# Patient Record
Sex: Male | Born: 1995 | Race: White | Hispanic: No | Marital: Single | Smoking: Never smoker
Health system: Southern US, Community
[De-identification: ages and names within clinical notes are randomized; demographics above are authoritative.]

## PROBLEM LIST (undated history)

## (undated) DIAGNOSIS — K219 Gastro-esophageal reflux disease without esophagitis: Secondary | ICD-10-CM

---

## 2015-05-26 ENCOUNTER — Emergency Department
Admission: EM | Admit: 2015-05-26 | Discharge: 2015-05-26 | Disposition: A | Discharge status: Home / Self Care | Payer: Commercial Managed Care - HMO | Attending: Emergency Medicine | Admitting: Emergency Medicine

## 2015-05-26 ENCOUNTER — Encounter: Payer: Self-pay | Admitting: Emergency Medicine

## 2015-05-26 DIAGNOSIS — K219 Gastro-esophageal reflux disease without esophagitis: Secondary | ICD-10-CM | POA: Diagnosis not present

## 2015-05-26 DIAGNOSIS — R1013 Epigastric pain: Secondary | ICD-10-CM | POA: Diagnosis present

## 2015-05-26 DIAGNOSIS — J020 Streptococcal pharyngitis: Secondary | ICD-10-CM | POA: Diagnosis not present

## 2015-05-26 LAB — LIPASE, BLOOD: Lipase: 22 U/L (ref 11–51)

## 2015-05-26 LAB — COMPREHENSIVE METABOLIC PANEL
ALK PHOS: 89 U/L (ref 38–126)
ALT: 18 U/L (ref 17–63)
AST: 20 U/L (ref 15–41)
Albumin: 5.1 g/dL — ABNORMAL HIGH (ref 3.5–5.0)
Anion gap: 15 (ref 5–15)
BILIRUBIN TOTAL: 1.5 mg/dL — AB (ref 0.3–1.2)
BUN: 17 mg/dL (ref 6–20)
CALCIUM: 10.1 mg/dL (ref 8.9–10.3)
CO2: 27 mmol/L (ref 22–32)
CREATININE: 0.79 mg/dL (ref 0.61–1.24)
Chloride: 98 mmol/L — ABNORMAL LOW (ref 101–111)
GFR calc Af Amer: >60 mL/min (ref >60)
Glucose, Bld: 84 mg/dL (ref 65–99)
POTASSIUM: 4.4 mmol/L (ref 3.5–5.1)
Sodium: 140 mmol/L (ref 135–145)
TOTAL PROTEIN: 9.1 g/dL — AB (ref 6.5–8.1)

## 2015-05-26 LAB — URINALYSIS COMPLETE WITH MICROSCOPIC (ARMC ONLY)
Bacteria, UA: NONE SEEN
Bilirubin Urine: NEGATIVE
GLUCOSE, UA: NEGATIVE mg/dL
Hgb urine dipstick: NEGATIVE
LEUKOCYTES UA: NEGATIVE
NITRITE: NEGATIVE
Protein, ur: 30 mg/dL — AB
SPECIFIC GRAVITY, URINE: 1.028 (ref 1.005–1.030)
pH: 5 (ref 5.0–8.0)

## 2015-05-26 LAB — CBC
HCT: 44.5 % (ref 40.0–52.0)
Hemoglobin: 15.1 g/dL (ref 13.0–18.0)
MCH: 29.3 pg (ref 26.0–34.0)
MCHC: 34 g/dL (ref 32.0–36.0)
MCV: 86 fL (ref 80.0–100.0)
PLATELETS: 176 10*3/uL (ref 150–440)
RBC: 5.17 MIL/uL (ref 4.40–5.90)
RDW: 12.3 % (ref 11.5–14.5)
WBC: 8.7 10*3/uL (ref 3.8–10.6)

## 2015-05-26 MED ORDER — ONDANSETRON 8 MG PO TBDP
ORAL_TABLET | ORAL | Status: AC
  Administered 2015-05-26: 8 mg via ORAL
  Filled 2015-05-26: qty 1

## 2015-05-26 MED ORDER — GI COCKTAIL ~~LOC~~
30.0000 mL | Freq: Once | ORAL | Status: AC
  Administered 2015-05-26: 30 mL via ORAL
  Filled 2015-05-26: qty 30

## 2015-05-26 MED ORDER — ALUMINUM & MAGNESIUM HYDROXIDE 200-200 MG/5ML PO SUSP: 5.0000 mL | Freq: Four times a day (QID) | ORAL | Status: AC | PRN

## 2015-05-26 MED ORDER — LIDOCAINE VISCOUS 2 % MT SOLN: 5.0000 mL | OROMUCOSAL | Status: AC | PRN

## 2015-05-26 MED ORDER — ONDANSETRON 4 MG PO TBDP: 4.0000 mg | ORAL_TABLET | Freq: Three times a day (TID) | ORAL | Status: AC | PRN

## 2015-05-26 MED ORDER — ONDANSETRON 8 MG PO TBDP
8.0000 mg | ORAL_TABLET | Freq: Once | ORAL | Status: AC
  Administered 2015-05-26: 8 mg via ORAL

## 2015-05-26 MED ORDER — SODIUM CHLORIDE 0.9 % IV SOLN
Freq: Once | INTRAVENOUS | Status: AC
  Administered 2015-05-26: 14:00:00 via INTRAVENOUS

## 2015-05-26 MED ORDER — ONDANSETRON HCL 4 MG/2ML IJ SOLN
4.0000 mg | Freq: Once | INTRAMUSCULAR | Status: AC
  Administered 2015-05-26: 4 mg via INTRAVENOUS
  Filled 2015-05-26: qty 2

## 2015-05-26 NOTE — ED Provider Notes (Signed)
[facility] Emergency Department Provider Note  ____________________________________________  Time seen: Approximately 2:29 PM  I have reviewed the triage vital signs and the nursing notes.   HISTORY  Chief Complaint Abdominal Pain    HPI Martin Ramos is a 20 y.o. male who presentsto the emergency department complaining of epigastric pain. Patient has a diagnosis of strep throat and is on antibiotics for same. He has been followed by his pediatrician. Patient is an into his pediatrician this morning and labs were undertaken with a negative Monospot as well as a CBC within normal limits. Patient continued to have a burning sensation in his chest and upper epigastric region who presents to the emergency department. Patient's diagnosis is strep was 4 days prior and he has been taking amoxicillin since. Patient endorses decreased oral intake due to the burning sensation when he eats. He has not been taking his antibiotic with food. He denies any increase in fevers or chills, difficulty swallowing, chest pain, shortness of breath, nausea or vomiting, diarrhea or constipation. Denies any urinary complaints or flank pain.  She does endorse a history of GERD and states that he takes omeprazole for same. He is not taking his omeprazole within the last several days.    History reviewed. No pertinent past medical history.  There are no active problems to display for this patient.   History reviewed. No pertinent past surgical history.  Current Outpatient Rx  Name  Route  Sig  Dispense  Refill  . aluminum-magnesium hydroxide 200-200 MG/5ML suspension   Oral   Take 5 mLs by mouth every 6 (six) hours as needed for indigestion.   100 mL   0   . lidocaine (XYLOCAINE) 2 % solution   Mouth/Throat   Use as directed 5 mLs in the mouth or throat as needed for mouth pain.   50 mL   0   . ondansetron (ZOFRAN-ODT) 4 MG disintegrating tablet   Oral   Take 1 tablet  (4 mg total) by mouth every 8 (eight) hours as needed for nausea or vomiting.   20 tablet   0     Allergies Review of patient's allergies indicates no known allergies.  No family history on file.  Social History Social History  Substance Use Topics  . Smoking status: Never Smoker   . Smokeless tobacco: None  . Alcohol Use: No     Review of Systems  Constitutional: No fever/chills Eyes: No visual changes. No discharge ENT: Positive for sore throat but states that pain is improving on antibiotics. Cardiovascular: no chest pain. Respiratory: no cough. No SOB. Gastrointestinal: Positive for epigastric pain. Positive for "burning sensation" when eating.  No nausea, no vomiting.  No diarrhea.  No constipation. Genitourinary: Negative for dysuria. No hematuria Musculoskeletal: Negative for back pain. Skin: Negative for rash. Neurological: Negative for headaches, focal weakness or numbness. 10-point ROS otherwise negative.  ____________________________________________   PHYSICAL EXAM:  VITAL SIGNS: ED Triage Vitals  Enc Vitals Group     BP 05/26/15 1203 145/76 mmHg     Pulse Rate 05/26/15 1203 107     Resp 05/26/15 1203 18     Temp 05/26/15 1203 98.7 F (37.1 C)     Temp Source 05/26/15 1203 Oral     SpO2 05/26/15 1203 99 %     Weight 05/26/15 1203 142 lb (64.411 kg)     Height 05/26/15 1203  (1.651 m)     Head Cir --  Peak Flow --      Pain Score 05/26/15 1200 7     Pain Loc --      Pain Edu? --      Excl. in GC? --      Constitutional: Alert and oriented. Well appearing and in no acute distress. Eyes: Conjunctivae are normal. PERRL. EOMI. Head: Atraumatic. ENT:      Ears:       Nose: No congestion/rhinnorhea.      Mouth/Throat: Mucous membranes are moist. Oropharynx is slightly erythematous. Tonsils are only her thumb is edematous. No exudates visualized. Neck: No stridor.   Hematological/Lymphatic/Immunilogical: No cervical  lymphadenopathy. Cardiovascular: Normal rate, regular rhythm. Normal S1 and S2.  Good peripheral circulation. Respiratory: Normal respiratory effort without tachypnea or retractions. Lungs CTAB. Gastrointestinal: Bowel sounds 4 quadrants Soft to palpation. Mildly tender to palpation epigastric region. No guarding or rigidity.. No distention. No CVA tenderness. Musculoskeletal: No lower extremity tenderness nor edema.  No joint effusions. Neurologic:  Normal speech and language. No gross focal neurologic deficits are appreciated.  Skin:  Skin is warm, dry and intact. No rash noted. Psychiatric: Mood and affect are normal. Speech and behavior are normal. Patient exhibits appropriate insight and judgement.   ____________________________________________   LABS (all labs ordered are listed, but only abnormal results are displayed)  Labs Reviewed  COMPREHENSIVE METABOLIC PANEL - Abnormal; Notable for the following:    Chloride 98 (*)    Total Protein 9.1 (*)    Albumin 5.1 (*)    Total Bilirubin 1.5 (*)    All other components within normal limits  URINALYSIS COMPLETEWITH MICROSCOPIC ([facility] ONLY) - Abnormal; Notable for the following:    Color, Urine YELLOW (*)    APPearance CLEAR (*)    Ketones, ur 2+ (*)    Protein, ur 30 (*)    Squamous Epithelial / LPF 0-5 (*)    All other components within normal limits  LIPASE, BLOOD  CBC   ____________________________________________  EKG   ____________________________________________  RADIOLOGY   No results found.  ____________________________________________    PROCEDURES  Procedure(s) performed:       Medications  ondansetron (ZOFRAN) injection 4 mg (4 mg Intravenous Given 05/26/15 1223)  0.9 %  sodium chloride infusion ( Intravenous Stopped 05/26/15 1521)  gi cocktail (Maalox,Lidocaine,Donnatal) (30 mLs Oral Given 05/26/15 1520)  ondansetron (ZOFRAN-ODT) disintegrating tablet 8 mg (8 mg Oral Given 05/26/15 1455)      ____________________________________________   INITIAL IMPRESSION / ASSESSMENT AND PLAN / ED COURSE  Pertinent labs & imaging results that were available during my care of the patient were reviewed by me and considered in my medical decision making (see chart for details).  Patient's diagnosis is consistent with GERD. Patient also has a known diagnosis of strep throat for which he is on amoxicillin. Patient states that he has had some upper abdominal pain and burning sensation in his chest. He does have a history of GERD but has not been taking his omeprazole for same. Patient has been taking his antibiotics without food states that symptoms of strep throat or improving but the abdominal pain and burning have increased. Patient's lab results are are unremarkable at this time. Discussed findings with patient and his mother. Patient was given a GI cocktail here in the emergency department and a food challenge following. Patient was able to tolerate oral intake with no palpitations and no return of symptoms.. Patient will be discharged home with prescriptions for Maalox, viscous lidocaine, Zofran. Patient  is to follow up with primary care provider if symptoms persist past this treatment course. Patient is given ED precautions to return to the ED for any worsening or new symptoms.     ____________________________________________  FINAL CLINICAL IMPRESSION(S) / ED DIAGNOSES  Final diagnoses:  Gastroesophageal reflux disease, esophagitis presence not specified  Strep throat      NEW MEDICATIONS STARTED DURING THIS VISIT:  New Prescriptions   ALUMINUM-MAGNESIUM HYDROXIDE 200-200 MG/5ML SUSPENSION    Take 5 mLs by mouth every 6 (six) hours as needed for indigestion.   LIDOCAINE (XYLOCAINE) 2 % SOLUTION    Use as directed 5 mLs in the mouth or throat as needed for mouth pain.   ONDANSETRON (ZOFRAN-ODT) 4 MG DISINTEGRATING TABLET    Take 1 tablet (4 mg total) by mouth every 8 (eight) hours  as needed for nausea or vomiting.        Delorise Royals Cuthriell, PA-C 05/26/15 1631  Rockne Menghini, MD 05/30/15 1941

## 2015-05-26 NOTE — ED Notes (Signed)
Per mother he was recently dx'd with strep   No having n/v and epigastric pain

## 2015-05-26 NOTE — Discharge Instructions (Signed)
Gastroesophageal Reflux Disease, Adult Normally, food travels down the esophagus and stays in the stomach to be digested. However, when a person has gastroesophageal reflux disease (GERD), food and stomach acid move back up into the esophagus. When this happens, the esophagus becomes sore and inflamed. Over time, GERD can create small holes (ulcers) in the lining of the esophagus.  CAUSES This condition is caused by a problem with the muscle between the esophagus and the stomach (lower esophageal sphincter, or LES). Normally, the LES muscle closes after food passes through the esophagus to the stomach. When the LES is weakened or abnormal, it does not close properly, and that allows food and stomach acid to go back up into the esophagus. The LES can be weakened by certain dietary substances, medicines, and medical conditions, including:  Tobacco use.  Pregnancy.  Having a hiatal hernia.  Heavy alcohol use.  Certain foods and beverages, such as coffee, chocolate, onions, and peppermint. RISK FACTORS This condition is more likely to develop in:  People who have an increased body weight.  People who have connective tissue disorders.  People who use NSAID medicines. SYMPTOMS Symptoms of this condition include:  Heartburn.  Difficult or painful swallowing.  The feeling of having a lump in the throat.  Abitter taste in the mouth.  Bad breath.  Having a large amount of saliva.  Having an upset or bloated stomach.  Belching.  Chest pain.  Shortness of breath or wheezing.  Ongoing (chronic) cough or a night-time cough.  Wearing away of tooth enamel.  Weight loss. Different conditions can cause chest pain. Make sure to see your health care provider if you experience chest pain. DIAGNOSIS Your health care provider will take a medical history and perform a physical exam. To determine if you have mild or severe GERD, your health care provider may also monitor how you respond  to treatment. You may also have other tests, including:  An endoscopy toexamine your stomach and esophagus with a small camera.  A test thatmeasures the acidity level in your esophagus.  A test thatmeasures how much pressure is on your esophagus.  A barium swallow or modified barium swallow to show the shape, size, and functioning of your esophagus. TREATMENT The goal of treatment is to help relieve your symptoms and to prevent complications. Treatment for this condition may vary depending on how severe your symptoms are. Your health care provider may recommend:  Changes to your diet.  Medicine.  Surgery. HOME CARE INSTRUCTIONS Diet  Follow a diet as recommended by your health care provider. This may involve avoiding foods and drinks such as:  Coffee and tea (with or without caffeine).  Drinks that containalcohol.  Energy drinks and sports drinks.  Carbonated drinks or sodas.  Chocolate and cocoa.  Peppermint and mint flavorings.  Garlic and onions.  Horseradish.  Spicy and acidic foods, including peppers, chili powder, curry powder, vinegar, hot sauces, and barbecue sauce.  Citrus fruit juices and citrus fruits, such as oranges, lemons, and limes.  Tomato-based foods, such as red sauce, chili, salsa, and pizza with red sauce.  Fried and fatty foods, such as donuts, french fries, potato chips, and high-fat dressings.  High-fat meats, such as hot dogs and fatty cuts of red and white meats, such as rib eye steak, sausage, ham, and bacon.  High-fat dairy items, such as whole milk, butter, and cream cheese.  Eat small, frequent meals instead of large meals.  Avoid drinking large amounts of liquid with your  meals.  Avoid eating meals during the 2-3 hours before bedtime.  Avoid lying down right after you eat.  Do not exercise right after you eat. General Instructions  Pay attention to any changes in your symptoms.  Take over-the-counter and prescription  medicines only as told by your health care provider. Do not take aspirin, ibuprofen, or other NSAIDs unless your health care provider told you to do so.  Do not use any tobacco products, including cigarettes, chewing tobacco, and e-cigarettes. If you need help quitting, ask your health care provider.  Wear loose-fitting clothing. Do not wear anything tight around your waist that causes pressure on your abdomen.  Raise (elevate) the head of your bed 6 inches (15cm).  Try to reduce your stress, such as with yoga or meditation. If you need help reducing stress, ask your health care provider.  If you are overweight, reduce your weight to an amount that is healthy for you. Ask your health care provider for guidance about a safe weight loss goal.  Keep all follow-up visits as told by your health care provider. This is important. SEEK MEDICAL CARE IF:  You have new symptoms.  You have unexplained weight loss.  You have difficulty swallowing, or it hurts to swallow.  You have wheezing or a persistent cough.  Your symptoms do not improve with treatment.  You have a hoarse voice. SEEK IMMEDIATE MEDICAL CARE IF:  You have pain in your arms, neck, jaw, teeth, or back.  You feel sweaty, dizzy, or light-headed.  You have chest pain or shortness of breath.  You vomit and your vomit looks like blood or coffee grounds.  You faint.  Your stool is bloody or black.  You cannot swallow, drink, or eat.   This information is not intended to replace advice given to you by your health care provider. Make sure you discuss any questions you have with your health care provider.   Document Released: 01/31/2005 Document Revised: 01/12/2015 Document Reviewed: 08/18/2014 Elsevier Interactive Patient Education 2016 [place] for Gastroesophageal Reflux Disease, Adult When you have gastroesophageal reflux disease (GERD), the foods you eat and your eating habits are very important.  Choosing the right foods can help ease the discomfort of GERD. WHAT GENERAL GUIDELINES DO I NEED TO FOLLOW?  Choose fruits, vegetables, whole grains, low-fat dairy products, and low-fat meat, fish, and poultry.  Limit fats such as oils, salad dressings, butter, nuts, and avocado.  Keep a food diary to identify foods that cause symptoms.  Avoid foods that cause reflux. These may be different for different people.  Eat frequent small meals instead of three large meals each day.  Eat your meals slowly, in a relaxed setting.  Limit fried foods.  Cook foods using methods other than frying.  Avoid drinking alcohol.  Avoid drinking large amounts of liquids with your meals.  Avoid bending over or lying down until 2-3 hours after eating. WHAT FOODS ARE NOT RECOMMENDED? The following are some foods and drinks that may worsen your symptoms: Vegetables Tomatoes. Tomato juice. Tomato and spaghetti sauce. Chili peppers. Onion and garlic. Horseradish. Fruits Oranges, grapefruit, and lemon (fruit and juice). Meats High-fat meats, fish, and poultry. This includes hot dogs, ribs, ham, sausage, salami, and bacon. Dairy Whole milk and chocolate milk. Sour cream. Cream. Butter. Ice cream. Cream cheese.  Beverages Coffee and tea, with or without caffeine. Carbonated beverages or energy drinks. Condiments Hot sauce. Barbecue sauce.  Sweets/Desserts Chocolate and cocoa. Donuts. Peppermint and  spearmint. Fats and Oils High-fat foods, including [place] fries and potato chips. Other Vinegar. Strong spices, such as black pepper, white pepper, red pepper, cayenne, curry powder, cloves, ginger, and chili powder. The items listed above may not be a complete list of foods and beverages to avoid. Contact your dietitian for more information.   This information is not intended to replace advice given to you by your health care provider. Make sure you discuss any questions you have with your health care  provider.   Document Released: 04/23/2005 Document Revised: 05/14/2014 Document Reviewed: 02/25/2013 Elsevier Interactive Patient Education 2016 [employer]  Strep Throat Strep throat is a bacterial infection of the throat. Your health care provider may call the infection tonsillitis or pharyngitis, depending on whether there is swelling in the tonsils or at the back of the throat. Strep throat is most common during the cold months of the year in children who are 57-12 years of age, but it can happen during any season in people of any age. This infection is spread from person to person (contagious) through coughing, sneezing, or close contact. CAUSES Strep throat is caused by the bacteria called Streptococcus pyogenes. RISK FACTORS This condition is more likely to develop in:  People who spend time in crowded places where the infection can spread easily.  People who have close contact with someone who has strep throat. SYMPTOMS Symptoms of this condition include:  Fever or chills.   Redness, swelling, or pain in the tonsils or throat.  Pain or difficulty when swallowing.  White or yellow spots on the tonsils or throat.  Swollen, tender glands in the neck or under the jaw.  Red rash all over the body (rare). DIAGNOSIS This condition is diagnosed by performing a rapid strep test or by taking a swab of your throat (throat culture test). Results from a rapid strep test are usually ready in a few minutes, but throat culture test results are available after one or two days. TREATMENT This condition is treated with antibiotic medicine. HOME CARE INSTRUCTIONS Medicines  Take over-the-counter and prescription medicines only as told by your health care provider.  Take your antibiotic as told by your health care provider. Do not stop taking the antibiotic even if you start to feel better.  Have family members who also have a sore throat or fever tested for strep throat. They may need  antibiotics if they have the strep infection. Eating and Drinking  Do not share food, drinking cups, or personal items that could cause the infection to spread to other people.  If swallowing is difficult, try eating soft foods until your sore throat feels better.  Drink enough fluid to keep your urine clear or pale yellow. General Instructions  Gargle with a salt-water mixture 3-4 times per day or as needed. To make a salt-water mixture, completely dissolve -1 tsp of salt in 1 cup of warm water.  Make sure that all household members wash their hands well.  Get plenty of rest.  Stay home from school or work until you have been taking antibiotics for 24 hours.  Keep all follow-up visits as told by your health care provider. This is important. SEEK MEDICAL CARE IF:  The glands in your neck continue to get bigger.  You develop a rash, cough, or earache.  You cough up a thick liquid that is green, yellow-brown, or bloody.  You have pain or discomfort that does not get better with medicine.  Your problems seem to be  getting worse rather than better.  You have a fever. SEEK IMMEDIATE MEDICAL CARE IF:  You have new symptoms, such as vomiting, severe headache, stiff or painful neck, chest pain, or shortness of breath.  You have severe throat pain, drooling, or changes in your voice.  You have swelling of the neck, or the skin on the neck becomes red and tender.  You have signs of dehydration, such as fatigue, dry mouth, and decreased urination.  You become increasingly sleepy, or you cannot wake up completely.  Your joints become red or painful.   This information is not intended to replace advice given to you by your health care provider. Make sure you discuss any questions you have with your health care provider.   Document Released: 04/20/2000 Document Revised: 01/12/2015 Document Reviewed: 08/16/2014 Elsevier Interactive Patient Education [employer]

## 2015-05-26 NOTE — ED Notes (Signed)
Pt discharged home after verbalizing understanding of discharge instructions; nad noted. 

## 2015-05-28 ENCOUNTER — Encounter: Payer: Self-pay | Admitting: Emergency Medicine

## 2015-05-28 ENCOUNTER — Emergency Department
Admission: EM | Admit: 2015-05-28 | Discharge: 2015-05-28 | Disposition: A | Discharge status: Home / Self Care | Payer: Commercial Managed Care - HMO | Attending: Student | Admitting: Student

## 2015-05-28 ENCOUNTER — Emergency Department: Payer: Commercial Managed Care - HMO

## 2015-05-28 DIAGNOSIS — K209 Esophagitis, unspecified without bleeding: Secondary | ICD-10-CM

## 2015-05-28 DIAGNOSIS — J029 Acute pharyngitis, unspecified: Secondary | ICD-10-CM | POA: Diagnosis present

## 2015-05-28 HISTORY — DX: Gastro-esophageal reflux disease without esophagitis: K21.9

## 2015-05-28 LAB — URINALYSIS COMPLETE WITH MICROSCOPIC (ARMC ONLY)
BILIRUBIN URINE: NEGATIVE
Bacteria, UA: NONE SEEN
GLUCOSE, UA: NEGATIVE mg/dL
Hgb urine dipstick: NEGATIVE
Ketones, ur: NEGATIVE mg/dL
Leukocytes, UA: NEGATIVE
Nitrite: NEGATIVE
Protein, ur: 30 mg/dL — AB
Specific Gravity, Urine: 1.03 (ref 1.005–1.030)
Squamous Epithelial / LPF: NONE SEEN
pH: 5 (ref 5.0–8.0)

## 2015-05-28 LAB — CBC WITH DIFFERENTIAL/PLATELET
Basophils Absolute: 0.1 10*3/uL (ref 0–0.1)
Basophils Relative: 1 %
Eosinophils Absolute: 0 10*3/uL (ref 0–0.7)
Eosinophils Relative: 0 %
HEMATOCRIT: 45.1 % (ref 40.0–52.0)
HEMOGLOBIN: 15.5 g/dL (ref 13.0–18.0)
LYMPHS ABS: 2.5 10*3/uL (ref 1.0–3.6)
LYMPHS PCT: 34 %
MCH: 29 pg (ref 26.0–34.0)
MCHC: 34.3 g/dL (ref 32.0–36.0)
MCV: 84.4 fL (ref 80.0–100.0)
Monocytes Absolute: 0.4 10*3/uL (ref 0.2–1.0)
Monocytes Relative: 6 %
NEUTROS ABS: 4.3 10*3/uL (ref 1.4–6.5)
NEUTROS PCT: 59 %
Platelets: 195 10*3/uL (ref 150–440)
RBC: 5.34 MIL/uL (ref 4.40–5.90)
RDW: 12.5 % (ref 11.5–14.5)
WBC: 7.3 10*3/uL (ref 3.8–10.6)

## 2015-05-28 LAB — COMPREHENSIVE METABOLIC PANEL
ALK PHOS: 91 U/L (ref 38–126)
ALT: 17 U/L (ref 17–63)
AST: 16 U/L (ref 15–41)
Albumin: 5.5 g/dL — ABNORMAL HIGH (ref 3.5–5.0)
Anion gap: 9 (ref 5–15)
BUN: 18 mg/dL (ref 6–20)
CO2: 30 mmol/L (ref 22–32)
CREATININE: 0.88 mg/dL (ref 0.61–1.24)
Calcium: 10.2 mg/dL (ref 8.9–10.3)
Chloride: 101 mmol/L (ref 101–111)
Glucose, Bld: 96 mg/dL (ref 65–99)
Potassium: 3.7 mmol/L (ref 3.5–5.1)
Sodium: 140 mmol/L (ref 135–145)
TOTAL PROTEIN: 9.4 g/dL — AB (ref 6.5–8.1)
Total Bilirubin: 0.5 mg/dL (ref 0.3–1.2)

## 2015-05-28 LAB — MONONUCLEOSIS SCREEN: Mono Screen: NEGATIVE

## 2015-05-28 MED ORDER — SODIUM CHLORIDE 0.9 % IV BOLUS (SEPSIS)
1000.0000 mL | Freq: Once | INTRAVENOUS | Status: AC
  Administered 2015-05-28: 1000 mL via INTRAVENOUS

## 2015-05-28 MED ORDER — PANTOPRAZOLE SODIUM 40 MG IV SOLR
40.0000 mg | Freq: Once | INTRAVENOUS | Status: AC
  Administered 2015-05-28: 40 mg via INTRAVENOUS
  Filled 2015-05-28: qty 40

## 2015-05-28 MED ORDER — SUCRALFATE 1 GM/10ML PO SUSP: 1.0000 g | Freq: Four times a day (QID) | ORAL | Status: AC

## 2015-05-28 MED ORDER — PANTOPRAZOLE SODIUM 40 MG PO TBEC: 40.0000 mg | DELAYED_RELEASE_TABLET | Freq: Every day | ORAL | Status: AC

## 2015-05-28 MED ORDER — SUCRALFATE 1 GM/10ML PO SUSP
1.0000 g | Freq: Once | ORAL | Status: AC
  Administered 2015-05-28: 1 g via ORAL
  Filled 2015-05-28: qty 10

## 2015-05-28 NOTE — ED Provider Notes (Signed)
[facility] Emergency Department Provider Note  ____________________________________________  Time seen: Approximately 12:51 PM  I have reviewed the triage vital signs and the nursing notes.   HISTORY  Chief Complaint Sore Throat   HPI Martin Ramos is a 20 y.o. male is here with parents with complaint of unable eat for 5 days. He was seen in the emergency room on 1/19 with the same complaints. At that time it was felt to be epigastric since he has a history of GERD and he was discharged on Zofran and a GI cocktail. Patient states that the lidocaine helps for 5 minutes but during that time he does not eat or drink. He states he is unable to eat or drink without pain and therefore doesn't. History also is that he was treated for strep throat prior to this. This morning parents went to [facility] clinic walk-in and was told to come to the emergency room again for evaluation. Patient states that he had a low-grade fever for several days last week but not now.  Patient was diagnosed by a gastroenterologist and is in [place] for years ago and was started on omeprazole. They're unable to get in with this gastroenterologist as he is in a different office. Patient is been taking over-the-counter omeprazole with relief until just recently when he was taken the amoxicillin for strep throat. He attributes the amoxicillin to use a flareup of his GERD.   Past Medical History  Diagnosis Date  . GERD (gastroesophageal reflux disease)     There are no active problems to display for this patient.   History reviewed. No pertinent past surgical history.  Current Outpatient Rx  Name  Route  Sig  Dispense  Refill  . aluminum-magnesium hydroxide 200-200 MG/5ML suspension   Oral   Take 5 mLs by mouth every 6 (six) hours as needed for indigestion.   100 mL   0   . lidocaine (XYLOCAINE) 2 % solution   Mouth/Throat   Use as directed 5 mLs in the mouth or throat as needed for  mouth pain.   50 mL   0   . ondansetron (ZOFRAN-ODT) 4 MG disintegrating tablet   Oral   Take 1 tablet (4 mg total) by mouth every 8 (eight) hours as needed for nausea or vomiting.   20 tablet   0   . pantoprazole (PROTONIX) 40 MG tablet   Oral   Take 1 tablet (40 mg total) by mouth daily.   30 tablet   1   . sucralfate (CARAFATE) 1 GM/10ML suspension   Oral   Take 10 mLs (1 g total) by mouth 4 (four) times daily.   420 mL   1     Allergies Review of patient's allergies indicates no known allergies.  No family history on file.  Social History Social History  Substance Use Topics  . Smoking status: Never Smoker   . Smokeless tobacco: None  . Alcohol Use: No    Review of Systems Constitutional: Positive fever/chills and being treated for strep throat. Eyes: No visual changes. ENT: Resolved sore throat. Cardiovascular: Denies chest pain. Respiratory: Denies shortness of breath. Gastrointestinal: No abdominal pain.  Positive nausea, positive vomiting.  No diarrhea.  No constipation. Positive esophageal pain. Genitourinary: Negative for dysuria. Musculoskeletal: Negative for back pain. Skin: Negative for rash. Neurological: Negative for headaches, focal weakness or numbness.  10-point ROS otherwise negative.  ____________________________________________   PHYSICAL EXAM:  VITAL SIGNS: ED Triage Vitals  Enc Vitals  Group     BP 05/28/15 1227 119/80 mmHg     Pulse Rate 05/28/15 1227 81     Resp 05/28/15 1227 20     Temp 05/28/15 1227 98.4 F (36.9 C)     Temp Source 05/28/15 1227 Oral     SpO2 05/28/15 1227 99 %     Weight 05/28/15 1227 140 lb (63.504 kg)     Height 05/28/15 1227  (1.651 m)     Head Cir --      Peak Flow --      Pain Score 05/28/15 1229 2     Pain Loc --      Pain Edu? --      Excl. in GC? --     Constitutional: Alert and oriented. Well appearing and in no acute distress. Eyes: Conjunctivae are normal. PERRL. EOMI. Head:  Atraumatic. Nose: No congestion/rhinnorhea. Mouth/Throat: Mucous membranes are moist.  Oropharynx non-erythematous. No edema and patient is able to swallow his saliva without any difficulty. Neck: No stridor.   Hematological/Lymphatic/Immunilogical: No cervical lymphadenopathy. Cardiovascular: Normal rate, regular rhythm. Grossly normal heart sounds.  Good peripheral circulation. Respiratory: Normal respiratory effort.  No retractions. Lungs CTAB. Gastrointestinal: Soft and nontender. No distention. Bowel sounds normoactive 4 quadrants. Musculoskeletal: No lower extremity tenderness nor edema.  No joint effusions. Neurologic:  Normal speech and language. No gross focal neurologic deficits are appreciated. No gait instability. Skin:  Skin is warm, dry and intact. No rash noted. Psychiatric: Mood and affect are normal. Speech and behavior are normal.  ____________________________________________   LABS (all labs ordered are listed, but only abnormal results are displayed)  Labs Reviewed  COMPREHENSIVE METABOLIC PANEL - Abnormal; Notable for the following:    Total Protein 9.4 (*)    Albumin 5.5 (*)    All other components within normal limits  URINALYSIS COMPLETEWITH MICROSCOPIC ([facility] ONLY) - Abnormal; Notable for the following:    Color, Urine AMBER (*)    APPearance CLEAR (*)    Protein, ur 30 (*)    All other components within normal limits  CBC WITH DIFFERENTIAL/PLATELET  MONONUCLEOSIS SCREEN    RADIOLOGY  Chest x-ray per radiologist no active cardiopulmonary disease. ____________________________________________   PROCEDURES  Procedure(s) performed: None  Critical Care performed: No  ____________________________________________   INITIAL IMPRESSION / ASSESSMENT AND PLAN / ED COURSE  Pertinent labs & imaging results that were available during my care of the patient were reviewed by me and considered in my medical decision making (see chart for  details).  ----------------------------------------- 5:11 PM on 05/28/2015 ----------------------------------------- Patient was given the ice cream after 35-40 minutes of having the Carafate and was able to eat without any difficulty. He is to discontinue taking omeprazole and began on protonix 40 mg daily and Carafate 4 times a day. They are to call and get an appointment with the gastroenterologist on call. He is to avoid citric acid, caffeine, fatty or fried foods until then.  ____________________________________________   FINAL CLINICAL IMPRESSION(S) / ED DIAGNOSES  Final diagnoses:  Esophagitis      Tommi Rumps, PA-C 05/28/15 1733  Gayla Doss, MD 05/29/15 [phone]

## 2015-05-28 NOTE — ED Notes (Signed)
Pt's mother states pt has been unable to eat anything since Sunday d/t throat pain and stopped taking his antibiotic on Thursday because he "can't take it without anything on his stomach." Mother wants referral for GI specialist.

## 2015-05-28 NOTE — Discharge Instructions (Signed)
Esophagitis °Esophagitis is inflammation of the esophagus. The esophagus is the tube that carries food and liquids from your mouth to your stomach. Esophagitis can cause soreness or pain in the esophagus. This condition can make it difficult and painful to swallow.  °CAUSES °Most causes of esophagitis are not serious. Common causes of this condition include: °· Gastroesophageal reflux disease (GERD). This is when stomach contents move back up into the esophagus (reflux). °· Repeated vomiting. °· An allergic-type reaction, especially caused by food allergies (eosinophilic esophagitis). °· Injury to the esophagus by swallowing large pills with or without water, or swallowing certain types of medicines. °· Swallowing (ingesting) harmful chemicals, such as household cleaning products. °· Heavy alcohol use. °· An infection of the esophagus. This most often occurs in people who have a weakened immune system. °· Radiation or chemotherapy treatment for cancer. °· Certain diseases such as sarcoidosis, Crohn disease, and scleroderma. °SYMPTOMS °Symptoms of this condition include: °· Difficult or painful swallowing. °· Pain with swallowing acidic liquids, such as citrus juices. °· Pain with burping. °· Chest pain. °· Difficulty breathing. °· Nausea. °· Vomiting. °· Pain in the abdomen. °· Weight loss. °· Ulcers in the mouth. °· Patches of white material in the mouth (candidiasis). °· Fever. °· Coughing up blood or vomiting blood. °· Stool that is black, tarry, or bright red. °DIAGNOSIS °Your health care provider will take a medical history and perform a physical exam. You may also have other tests, including: °· An endoscopy to examine your stomach and esophagus with a small camera. °· A test that measures the acidity level in your esophagus. °· A test that measures how much pressure is on your esophagus. °· A barium swallow or modified barium swallow to show the shape, size, and functioning of your esophagus. °· Allergy  tests. °TREATMENT °Treatment for this condition depends on the cause of your esophagitis. In some cases, steroids or other medicines may be given to help relieve your symptoms or to treat the underlying cause of your condition. You may have to make some lifestyle changes, such as: °· Avoiding alcohol. °· Quitting smoking. °· Changing your diet. °· Exercising. °· Changing your sleep habits and your sleep environment. °HOME CARE INSTRUCTIONS °Take these actions to decrease your discomfort and to help avoid complications. °Diet °· Follow a diet as recommended by your health care provider. This may involve avoiding foods and drinks such as: °¨ Coffee and tea (with or without caffeine). °¨ Drinks that contain alcohol. °¨ Energy drinks and sports drinks. °¨ Carbonated drinks or sodas. °¨ Chocolate and cocoa. °¨ Peppermint and mint flavorings. °¨ Garlic and onions. °¨ Horseradish. °¨ Spicy and acidic foods, including peppers, chili powder, curry powder, vinegar, hot sauces, and barbecue sauce. °¨ Citrus fruit juices and citrus fruits, such as oranges, lemons, and limes. °¨ Tomato-based foods, such as red sauce, chili, salsa, and pizza with red sauce. °¨ Fried and fatty foods, such as donuts, french fries, potato chips, and high-fat dressings. °¨ High-fat meats, such as hot dogs and fatty cuts of red and white meats, such as rib eye steak, sausage, ham, and bacon. °¨ High-fat dairy items, such as whole milk, butter, and cream cheese. °· Eat small, frequent meals instead of large meals. °· Avoid drinking large amounts of liquid with your meals. °· Avoid eating meals during the 2-3 hours before bedtime. °· Avoid lying down right after you eat. °· Do not exercise right after you eat. °· Avoid foods and drinks that seem to   make your symptoms worse. General Instructions  Pay attention to any changes in your symptoms.  Take over-the-counter and prescription medicines only as told by your health care provider. Do not take  aspirin, ibuprofen, or other NSAIDs unless your health care provider told you to do so.  If you have trouble taking pills, use a pill splitter to decrease the size of the pill. This will decrease the chance of the pill getting stuck or injuring your esophagus on the way down. Also, drink water after you take a pill.  Do not use any tobacco products, including cigarettes, chewing tobacco, and e-cigarettes. If you need help quitting, ask your health care provider.  Wear loose-fitting clothing. Do not wear anything tight around your waist that causes pressure on your abdomen.  Raise (elevate) the head of your bed about 6 inches (15 cm).  Try to reduce your stress, such as with yoga or meditation. If you need help reducing stress, ask your health care provider.  If you are overweight, reduce your weight to an amount that is healthy for you. Ask your health care provider for guidance about a safe weight loss goal.  Keep all follow-up visits as told by your health care provider. This is important. SEEK MEDICAL CARE IF:  You have new symptoms.  You have unexplained weight loss.  You have difficulty swallowing, or it hurts to swallow.  You have wheezing or a persistent cough.  Your symptoms do not improve with treatment.  You have frequent heartburn for more than two weeks. SEEK IMMEDIATE MEDICAL CARE IF:  You have severe pain in your arms, neck, jaw, teeth, or back.  You feel sweaty, dizzy, or light-headed.  You have chest pain or shortness of breath.  You vomit and your vomit looks like blood or coffee grounds.  Your stool is bloody or black.  You have a fever.  You cannot swallow, drink, or eat.   This information is not intended to replace advice given to you by your health care provider. Make sure you discuss any questions you have with your health care provider.   Document Released: 05/31/2004 Document Revised: 01/12/2015 Document Reviewed: 08/18/2014 Elsevier Interactive  Patient Education 2016 [employer].    Call for appointment with gastroenterology. Discontinue taking omeprazole. Take protonix once a day starting Sunday. Carafate suspension 4 times a day as directed. Avoid any foods that have caffeine including chocolate. Avoid citric acid such as orange juice. Avoid fatty foods, greasy foods, fried foods until seen by the gastroenterologist. Eat small amounts of food at each meal.

## 2015-05-28 NOTE — ED Notes (Signed)
Diagnosed with strep throat on Monday 5 days ago, unable to complete antibiotics due to inability to swallow and continued throat pain.

## 2015-05-28 NOTE — ED Notes (Signed)
Discussed discharge instructions, prescriptions, and follow-up care with patient. No questions or concerns at this time. Pt stable at discharge.  

## 2017-05-09 IMAGING — CR DG CHEST 2V
1 series · 2 of 2 positions shown · non-contrast
Comparison: None.

CLINICAL DATA: Sore throat and difficulty swallowing

EXAM:
CHEST - 2 VIEW

[Series 1: dg chest 2 view · 0.14mm/px · 2 of 2 slices shown]
[im 1/2]
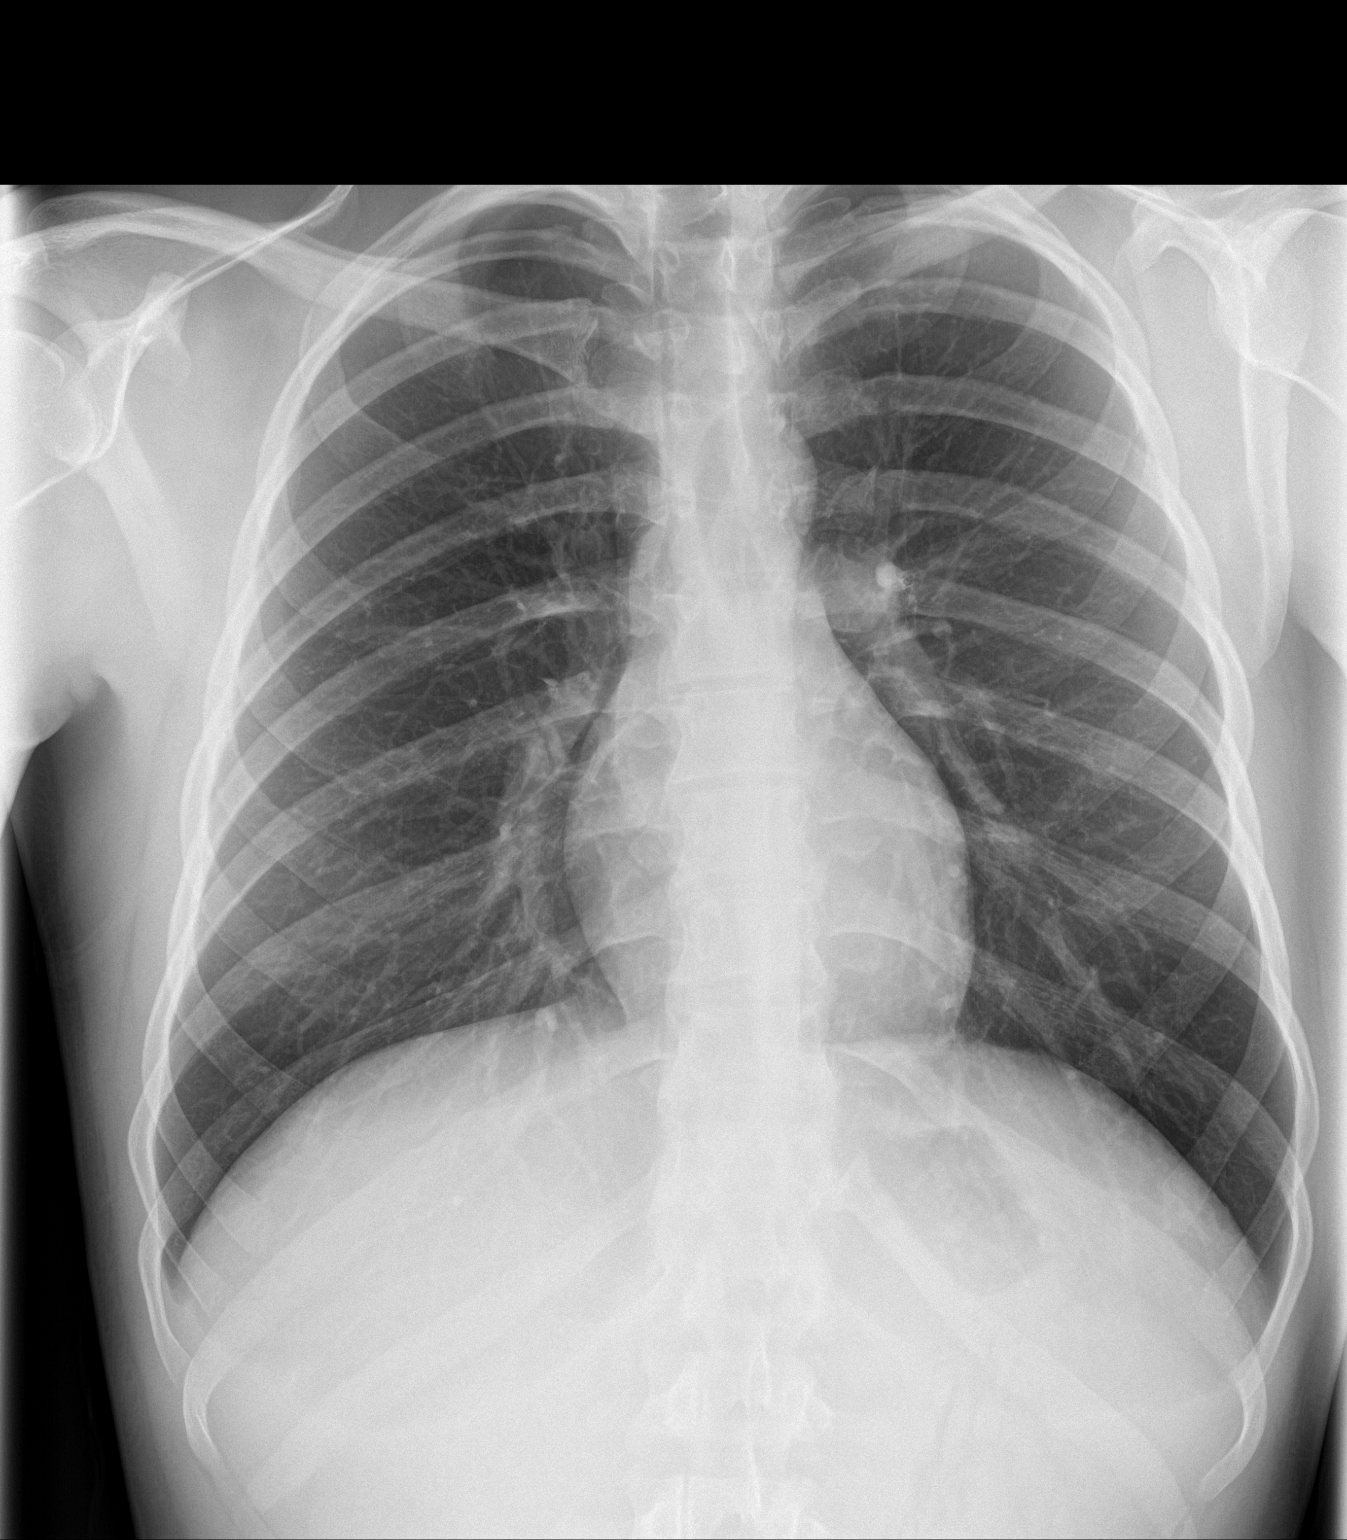
[im 2/2]
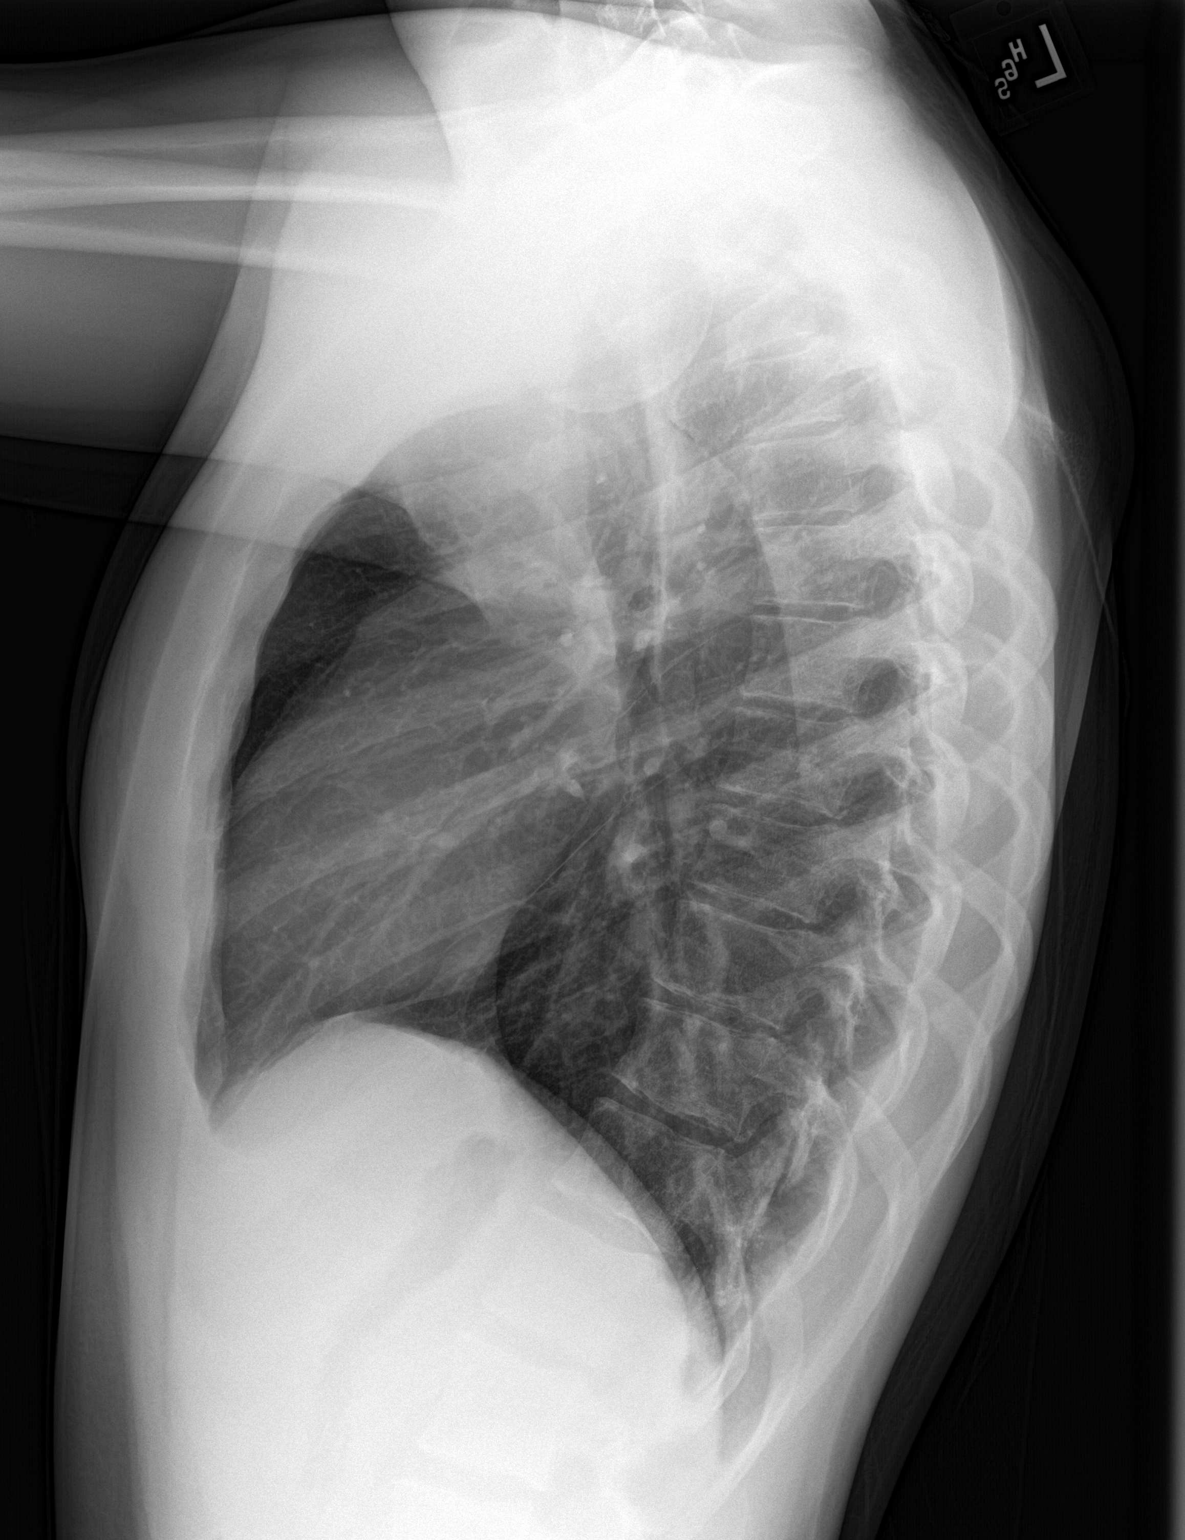

[2 of 2 positions shown; findings below may reference images not displayed]

FINDINGS: The heart size and mediastinal contours are within normal limits.
Both lungs are clear. The visualized skeletal structures are
unremarkable.
IMPRESSION: No active disease.
